# Patient Record
Sex: Female | Born: 1955 | Race: White | Hispanic: No | Marital: Married | Smoking: Never smoker
Health system: Southern US, Community
[De-identification: ages and names within clinical notes are randomized; demographics above are authoritative.]

---

## 2006-03-16 ENCOUNTER — Ambulatory Visit (HOSPITAL_COMMUNITY): Admission: RE | Admit: 2006-03-16 | Discharge: 2006-03-16 | Payer: Self-pay | Admitting: General Surgery

## 2006-03-16 ENCOUNTER — Encounter (INDEPENDENT_AMBULATORY_CARE_PROVIDER_SITE_OTHER): Payer: Self-pay | Admitting: *Deleted

## 2007-01-01 ENCOUNTER — Other Ambulatory Visit: Admission: RE | Admit: 2007-01-01 | Discharge: 2007-01-01 | Payer: Self-pay | Admitting: Family Medicine

## 2007-09-23 ENCOUNTER — Ambulatory Visit (HOSPITAL_BASED_OUTPATIENT_CLINIC_OR_DEPARTMENT_OTHER): Admission: RE | Admit: 2007-09-23 | Discharge: 2007-09-23 | Payer: Self-pay | Admitting: Orthopedic Surgery

## 2007-09-23 ENCOUNTER — Encounter (INDEPENDENT_AMBULATORY_CARE_PROVIDER_SITE_OTHER): Payer: Self-pay | Admitting: Orthopedic Surgery

## 2008-01-20 ENCOUNTER — Other Ambulatory Visit: Admission: RE | Admit: 2008-01-20 | Discharge: 2008-01-20 | Payer: Self-pay | Admitting: Family Medicine

## 2008-04-22 IMAGING — RF DG CHOLANGIOGRAM OPERATIVE
1 series · 1 of 1 positions shown · non-contrast
Comparison: none

CLINICAL DATA: Gallstones, cholecystectomy.
 OPERATIVE CHOLANGIOGRAM:

[Series 1: run · 1 of 1 slices shown]
[im 1/1]
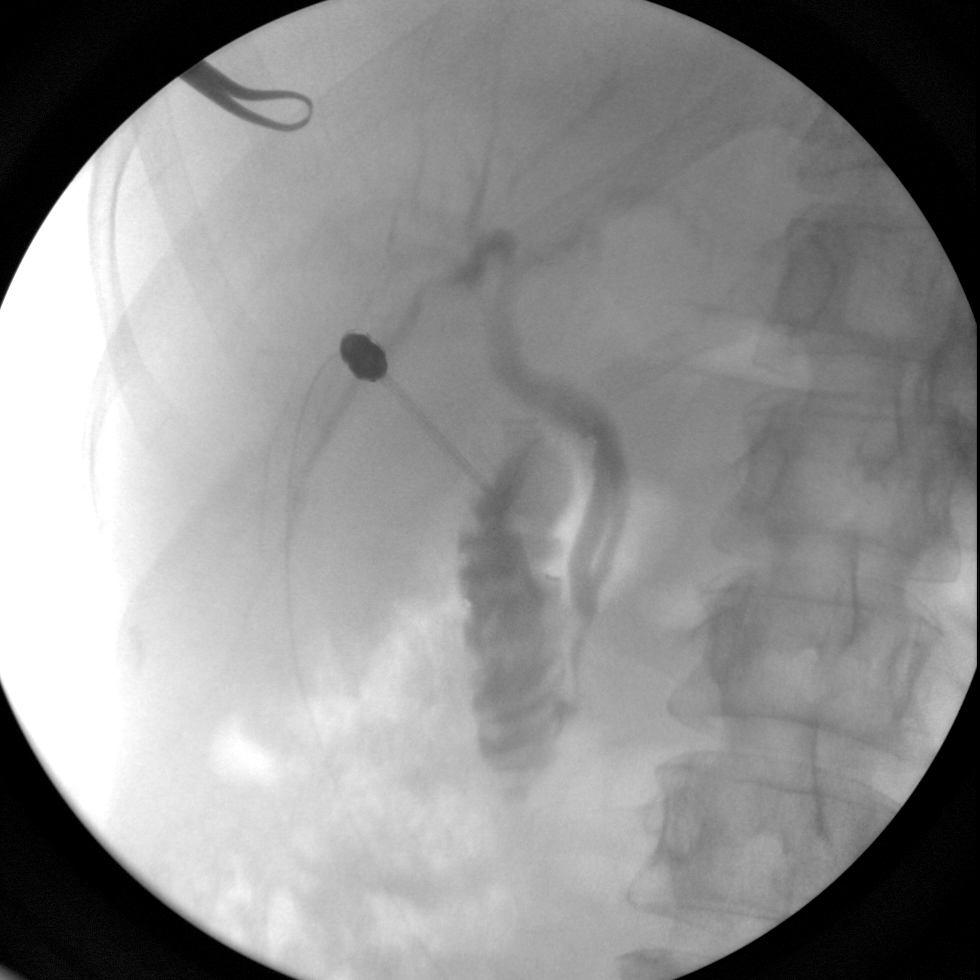

[1 of 1 positions shown; findings below may reference images not displayed]

FINDINGS: Only one image is received.  There is some blurring of anatomy apparently due to motion.  There is contrast material within the biliary ducts and duodenum.  Contrast may be present in the distal pancreatic duct.  On this single view I detect no evidence ductal stricture, dilatation, or filling defects.
IMPRESSION: Negative single view operative cholangiogram. Are there other views in a sequence, which are available for review?

## 2009-05-21 ENCOUNTER — Other Ambulatory Visit: Admission: RE | Admit: 2009-05-21 | Discharge: 2009-05-21 | Payer: Self-pay | Admitting: Family Medicine

## 2010-10-15 NOTE — Op Note (Signed)
Ashley Figueroa, Ashley Figueroa           ACCOUNT NO.:  [id number]   MEDICAL RECORD NO.:  [id number]          PATIENT TYPE:  AMB   LOCATION:  DSC                          FACILITY:  [facility]   PHYSICIAN:  Cindee Salt, M.D.       DATE OF BIRTH:  1955-07-22   DATE OF PROCEDURE:  DATE OF DISCHARGE:                               OPERATIVE REPORT   PREOPERATIVE DIAGNOSIS:  Mass, left thumb.   POSTOPERATIVE DIAGNOSIS:  Mass, left thumb.   OPERATION:  Excision of mass, left thumb.   SURGEON:  Cindee Salt, MD   ANESTHESIA:  Forearm based IV regional.   ANESTHESIOLOGIST:  Burna Forts, MD   HISTORY:  The patient is a 55 year old female with history of a mass and  pulp of her left thumb.  She is desirous of having this removed in that  it has caused a good deal of pain aggravation when she hits the area  that is her pinch area.  She is aware of the risks and complications  including recurrence, injury to arteries, nerves, tendons, incomplete  relief of symptoms, and painful scar in the pinch area of her thumb.  In  the preoperative area, the patient is seen.  The extremity marked by  both the patient and the surgeon.  Questions encouraged and answered.  Antibiotic given.   PROCEDURE:  The patient is brought to the operating room where a forearm  based IV regional anesthetic was carried out without difficulty.  She  was prepped using DuraPrep, supine position with the left arm free and  anesthesia given under the guidance of Dr. Jacklynn Bue.  After a time-out,  an oblique incision was made over the area of the mass, carried down  through subcutaneous tissue.  The mass was immediately encountered with  blunt and sharp dissection.  This was dissected free.  Then, the digital  nerve was identified.  This was protected.  The mass appeared to be  separated from this.  The mass was excised in total and sent to  Pathology.  No further lesions were identified.  The wound was  irrigated.  Skin closed  with interrupted 5-0 Vicryl Rapide sutures.  Sterile compressive dressing to the thumb applied.  The patient  tolerated the procedure well and was taken to the recovery room for  observation in satisfactory condition.  She will be discharged home, to  return to [facility] [place] in 1 week, on Vicodin.           ______________________________  Cindee Salt, M.D.     GK/MEDQ  D:  09/23/2007  T:  09/24/2007  Job:  [id number]

## 2010-10-18 NOTE — Op Note (Signed)
NAMEJIALI, Ashley Figueroa           ACCOUNT NO.:  [id number]   MEDICAL RECORD NO.:  [id number]          PATIENT TYPE:  AMB   LOCATION:  SDS                          FACILITY:  [facility]   PHYSICIAN:  Ollen Gross. Vernell Morgans, M.D. DATE OF BIRTH:  1956-05-19   DATE OF PROCEDURE:  03/16/2006  DATE OF DISCHARGE:  03/16/2006                               OPERATIVE REPORT   PREOPERATIVE DIAGNOSIS:  Gallstones.   POSTOP DIAGNOSIS:  Gallstones.   PROCEDURE:  Laparoscopic cholecystectomy with intraoperative  cholangiogram.   SURGEON:  Ollen Gross. Carolynne Edouard, M.D.   ASSISTANT:  Anselm Pancoast. Zachery Dakins, M.D.   ANESTHESIA:  General endotracheal.   DESCRIPTION OF PROCEDURE:  After informed consent was obtained, the  patient was brought to the operating room, and placed in supine position  on the operating table.  After induction of general anesthesia, the  patient's abdomen was prepped with Betadine and draped in the usual  sterile manner.  The area below the umbilicus was infiltrated with 1/4%  Marcaine.  A small incision was made with the 15-blade knife.  This  incision was carried down through the subcutaneous tissue, bluntly, with  the hemostat and [employer] retractors until the linea alba was  identified.   The linea alba was incised with the 15-blade knife and each side was  grasped with Kocher clamps and elevated anteriorly.  The preperitoneal  space was then probed bluntly with the hemostat until the peritoneum was  opened; and access was gained to the abdominal cavity.  A #0 Vicryl  pursestring stitch was placed in the fascia around the opening.  The  Hasson cannula was placed through the opening; and anchored in place  with the previously placed Vicryl pursestring stitch.  The abdomen was  then insufflated with carbon dioxide without difficulty.  The patient  was placed in the head-up position, and rotated slightly with the right  side up.  A laparoscope was inserted through the Hasson cannula and  the  right upper quadrant was inspected.  The dome of gallbladder and liver  readily identified.   Next, the epigastric region was infiltrated with 1/4% Marcaine.  A small  incision was made with a 15-blade knife, and a 10-mm port was placed  bluntly through this incision into the abdominal cavity under direct  vision.  Sites were then chosen laterally on the right side replacement  5-mm ports.  Each of these areas was infiltrated with 1/4% Marcaine.  Small stab incisions were made with the 15-blade knife.  Then 5-mm ports  were then placed bluntly through these incisions into the abdominal  cavity under direct vision.  A blunt grasper was placed through the  lateral-most 5-mm port, and used to grasp the dome of gallbladder and  elevate it anteriorly and superiorly.  Another blunt grasper was placed  through the other 5-mm port and used to retract the body and neck of the  gallbladder.  A dissector was placed through the epigastric port; and  using the electrocautery, the peritoneal reflection at the gallbladder  neck area was opened.  Blunt dissection was then carried out in this  area until the gallbladder neck cystic duct junction was readily  identified; and a good window was created.  A single clip was placed on  the gallbladder neck.   A small dichotomy was made just below the clip with the laparoscopic  scissors.  A 14-gauge Angiocath was then placed percutaneously through  the anterior abdominal wall under direct vision.  A Reddick  cholangiogram catheter was placed through the Angiocath and flushed.  The Reddick catheter was then placed within the cystic duct and anchored  in place with a clip.  A cholangiogram was obtained that showed no  filling defects, good emptying into the duodenum, and adequate length on  the cystic duct.  The anchoring clip and catheters were then removed  from the patient.  Three clips were placed proximally on the cystic  duct; and the duct was  divided between the two sets of clips.  Posterior  to this the cystic artery was identified; and again dissected bluntly in  a circumferential manner until a good window was created.  Two clips  were placed proximally and one distally on the artery; and the artery  was divided between the two sets of clips.   Next a laparoscopic hook cautery was used to separate the gallbladder  from the liver bed.  Prior to completely detaching the gallbladder from  liver bed, the liver bed was inspected and several small bleeding points  were coagulated with the electrocautery until the area was completely  hemostatic.  The gallbladder was then detached the rest of the way from  the liver bed without difficulty.  A laparoscopic bag was inserted  through the epigastric port.  The gallbladder was placed within the bag  and the bag was sealed.  The abdomen was then irrigated with copious  amounts of saline until the effluent was clear.  The laparoscope was  then moved to the epigastric port; and a gallbladder grasper was placed  through the Hasson cannula and used to grasp the opening of the bag.  The bag with the gallbladder was removed through the infraumbilical port  without difficulty with the Hasson cannula.  The fascial defect was then  closed with a previously placed Vicryl pursestring stitch as well as  with another interrupted #0 Vicryl stitch.  The rest of the ports were  removed under direct vision; and were found to be hemostatic.  The gas  was allowed to escape.  The skin incisions were all closed with  interrupted 4-0 Monocryl subcuticular stitches.  Benzoin and Steri-  Strips and sterile dressings were applied.  The patient tolerated the  procedure well.  At the end of the case, all needle, sponge, and  instrument counts were correct.  The patient was awake and taken to  recovery room in stable condition.      Ollen Gross. Vernell Morgans, M.D.  Electronically Signed     PST/MEDQ  D:   03/19/2006  T:  03/20/2006  Job:  [id number]

## 2011-05-23 ENCOUNTER — Other Ambulatory Visit: Payer: Self-pay | Admitting: Family Medicine

## 2011-05-23 ENCOUNTER — Other Ambulatory Visit (HOSPITAL_COMMUNITY)
Admission: RE | Admit: 2011-05-23 | Discharge: 2011-05-23 | Disposition: A | Discharge status: Home / Self Care | Payer: 59 | Source: Ambulatory Visit | Attending: Family Medicine | Admitting: Family Medicine

## 2011-05-23 DIAGNOSIS — Z01419 Encounter for gynecological examination (general) (routine) without abnormal findings: Secondary | ICD-10-CM | POA: Insufficient documentation

## 2014-05-08 ENCOUNTER — Other Ambulatory Visit (HOSPITAL_COMMUNITY)
Admission: RE | Admit: 2014-05-08 | Discharge: 2014-05-08 | Disposition: A | Discharge status: Home / Self Care | Payer: 59 | Source: Ambulatory Visit | Attending: Family Medicine | Admitting: Family Medicine

## 2014-05-08 DIAGNOSIS — Z124 Encounter for screening for malignant neoplasm of cervix: Secondary | ICD-10-CM | POA: Insufficient documentation

## 2014-05-09 ENCOUNTER — Other Ambulatory Visit: Payer: Self-pay | Admitting: Family Medicine

## 2014-05-11 LAB — CYTOLOGY - PAP

## 2016-06-27 DIAGNOSIS — E7800 Pure hypercholesterolemia, unspecified: Secondary | ICD-10-CM | POA: Diagnosis not present

## 2016-06-27 DIAGNOSIS — Z0000 Encounter for general adult medical examination without abnormal findings: Secondary | ICD-10-CM | POA: Diagnosis not present

## 2016-06-27 DIAGNOSIS — E559 Vitamin D deficiency, unspecified: Secondary | ICD-10-CM | POA: Diagnosis not present

## 2016-07-03 DIAGNOSIS — Z0000 Encounter for general adult medical examination without abnormal findings: Secondary | ICD-10-CM | POA: Diagnosis not present

## 2016-07-03 DIAGNOSIS — Z23 Encounter for immunization: Secondary | ICD-10-CM | POA: Diagnosis not present

## 2016-07-21 DIAGNOSIS — Z1231 Encounter for screening mammogram for malignant neoplasm of breast: Secondary | ICD-10-CM | POA: Diagnosis not present

## 2016-09-19 DIAGNOSIS — Z23 Encounter for immunization: Secondary | ICD-10-CM | POA: Diagnosis not present

## 2016-12-24 DIAGNOSIS — Z23 Encounter for immunization: Secondary | ICD-10-CM | POA: Diagnosis not present

## 2017-03-07 DIAGNOSIS — Z23 Encounter for immunization: Secondary | ICD-10-CM | POA: Diagnosis not present

## 2017-06-30 DIAGNOSIS — J0110 Acute frontal sinusitis, unspecified: Secondary | ICD-10-CM | POA: Diagnosis not present

## 2017-06-30 DIAGNOSIS — J0100 Acute maxillary sinusitis, unspecified: Secondary | ICD-10-CM | POA: Diagnosis not present

## 2017-06-30 DIAGNOSIS — H938X3 Other specified disorders of ear, bilateral: Secondary | ICD-10-CM | POA: Diagnosis not present

## 2017-07-06 ENCOUNTER — Other Ambulatory Visit: Payer: Self-pay | Admitting: Family Medicine

## 2017-07-06 ENCOUNTER — Other Ambulatory Visit (HOSPITAL_COMMUNITY)
Admission: RE | Admit: 2017-07-06 | Discharge: 2017-07-06 | Disposition: A | Discharge status: Home / Self Care | Payer: 59 | Source: Ambulatory Visit | Attending: Family Medicine | Admitting: Family Medicine

## 2017-07-06 DIAGNOSIS — Z0000 Encounter for general adult medical examination without abnormal findings: Secondary | ICD-10-CM | POA: Diagnosis not present

## 2017-07-06 DIAGNOSIS — M8588 Other specified disorders of bone density and structure, other site: Secondary | ICD-10-CM | POA: Diagnosis not present

## 2017-07-06 DIAGNOSIS — E559 Vitamin D deficiency, unspecified: Secondary | ICD-10-CM | POA: Diagnosis not present

## 2017-07-06 DIAGNOSIS — Z01419 Encounter for gynecological examination (general) (routine) without abnormal findings: Secondary | ICD-10-CM | POA: Insufficient documentation

## 2017-07-06 DIAGNOSIS — E7800 Pure hypercholesterolemia, unspecified: Secondary | ICD-10-CM | POA: Diagnosis not present

## 2017-07-07 LAB — CYTOLOGY - PAP: Diagnosis: NEGATIVE

## 2017-07-16 ENCOUNTER — Other Ambulatory Visit: Payer: Self-pay | Admitting: Family Medicine

## 2017-07-16 DIAGNOSIS — M8588 Other specified disorders of bone density and structure, other site: Secondary | ICD-10-CM

## 2017-07-22 DIAGNOSIS — M8589 Other specified disorders of bone density and structure, multiple sites: Secondary | ICD-10-CM | POA: Diagnosis not present

## 2017-07-22 DIAGNOSIS — Z1231 Encounter for screening mammogram for malignant neoplasm of breast: Secondary | ICD-10-CM | POA: Diagnosis not present

## 2017-11-03 DIAGNOSIS — H1045 Other chronic allergic conjunctivitis: Secondary | ICD-10-CM | POA: Diagnosis not present

## 2017-11-03 DIAGNOSIS — H18822 Corneal disorder due to contact lens, left eye: Secondary | ICD-10-CM | POA: Diagnosis not present

## 2018-03-11 DIAGNOSIS — K5900 Constipation, unspecified: Secondary | ICD-10-CM | POA: Diagnosis not present

## 2018-03-11 DIAGNOSIS — K648 Other hemorrhoids: Secondary | ICD-10-CM | POA: Diagnosis not present

## 2018-05-23 DIAGNOSIS — L03039 Cellulitis of unspecified toe: Secondary | ICD-10-CM | POA: Diagnosis not present

## 2018-06-03 DIAGNOSIS — L03031 Cellulitis of right toe: Secondary | ICD-10-CM | POA: Diagnosis not present

## 2018-07-12 DIAGNOSIS — E559 Vitamin D deficiency, unspecified: Secondary | ICD-10-CM | POA: Diagnosis not present

## 2018-07-12 DIAGNOSIS — Z0000 Encounter for general adult medical examination without abnormal findings: Secondary | ICD-10-CM | POA: Diagnosis not present

## 2018-07-12 DIAGNOSIS — E7800 Pure hypercholesterolemia, unspecified: Secondary | ICD-10-CM | POA: Diagnosis not present

## 2018-07-28 DIAGNOSIS — Z1231 Encounter for screening mammogram for malignant neoplasm of breast: Secondary | ICD-10-CM | POA: Diagnosis not present

## 2019-11-04 ENCOUNTER — Ambulatory Visit: Payer: 59 | Admitting: Podiatry

## 2019-11-04 ENCOUNTER — Encounter: Payer: Self-pay | Admitting: Podiatry

## 2019-11-04 ENCOUNTER — Other Ambulatory Visit: Payer: Self-pay

## 2019-11-04 DIAGNOSIS — L603 Nail dystrophy: Secondary | ICD-10-CM

## 2019-11-04 NOTE — Progress Notes (Signed)
°  Subjective:  Patient ID: Ashley Figueroa, female    DOB: 07/02/1955,  MRN: [id number] HPI Chief Complaint  Patient presents with   Nail Problem    Hallux right - thickened, discolored area x few years, tender at times especially with enclosed shoes, tried nail hardeners, open toe shoes, OTC antifungal, PCP rx'd antibiotic - just completed   New Patient (Initial Visit)    64 y.o. female presents with the above complaint.   ROS: Denies fever chills nausea vomiting muscle aches pains calf pain back pain chest pain shortness of breath.  No past medical history on file.   Current Outpatient Medications:    Multiple Vitamin (MULTIVITAMIN) capsule, Take 1 capsule by mouth daily., Disp: , Rfl:    atorvastatin (LIPITOR) 20 MG tablet, Take 20 mg by mouth daily., Disp: , Rfl:   No Known Allergies Review of Systems Objective:  There were no vitals filed for this visit.  General: Well developed, nourished, in no acute distress, alert and oriented x3   Dermatological: Skin is warm, dry and supple bilateral. Nails x 10 are well maintained; remaining integument appears unremarkable at this time. There are no open sores, no preulcerative lesions, no rash or signs of infection present.  Hallux nail right demonstrates distal onycholysis with no signs of bacterial or fungal infection.  Vascular: Dorsalis Pedis artery and Posterior Tibial artery pedal pulses are 2/4 bilateral with immedate capillary fill time. Pedal hair growth present. No varicosities and no lower extremity edema present bilateral.   Neruologic: Grossly intact via light touch bilateral. Vibratory intact via tuning fork bilateral. Protective threshold with Semmes Wienstein monofilament intact to all pedal sites bilateral. Patellar and Achilles deep tendon reflexes 2+ bilateral. No Babinski or clonus noted bilateral.   Musculoskeletal: No gross boney pedal deformities bilateral. No pain, crepitus, or limitation noted with foot  and ankle range of motion bilateral. Muscular strength 5/5 in all groups tested bilateral.  Gait: Unassisted, Nonantalgic.    Radiographs:  None taken  Assessment & Plan:   Assessment: Nail dystrophy hallux right  Plan: Recommended she continue current at home therapies because the nail does not appear to be fungal it appears that there has been trauma to the nailbed.     Beauregard Jarrells T. Albers, [place]

## 2020-06-11 ENCOUNTER — Other Ambulatory Visit: Payer: 59
